# Patient Record
Sex: Female | Born: 1970
Health system: Southern US, Community
[De-identification: ages and names within clinical notes are randomized; demographics above are authoritative.]

## PROBLEM LIST (undated history)

## (undated) DIAGNOSIS — E7800 Pure hypercholesterolemia, unspecified: Secondary | ICD-10-CM

## (undated) HISTORY — DX: Pure hypercholesterolemia, unspecified: E78.00

## (undated) HISTORY — PX: POLYPECTOMY: SHX149

---

## 2017-01-23 ENCOUNTER — Ambulatory Visit: Payer: Self-pay | Admitting: Certified Nurse Midwife

## 2017-02-04 DIAGNOSIS — Z1389 Encounter for screening for other disorder: Secondary | ICD-10-CM | POA: Diagnosis not present

## 2017-02-04 DIAGNOSIS — Z23 Encounter for immunization: Secondary | ICD-10-CM | POA: Diagnosis not present

## 2017-02-04 DIAGNOSIS — L728 Other follicular cysts of the skin and subcutaneous tissue: Secondary | ICD-10-CM | POA: Diagnosis not present

## 2017-02-04 DIAGNOSIS — E038 Other specified hypothyroidism: Secondary | ICD-10-CM | POA: Diagnosis not present

## 2017-02-13 ENCOUNTER — Ambulatory Visit (INDEPENDENT_AMBULATORY_CARE_PROVIDER_SITE_OTHER): Payer: BLUE CROSS/BLUE SHIELD | Admitting: Obstetrics and Gynecology

## 2017-02-13 ENCOUNTER — Encounter: Payer: Self-pay | Admitting: Obstetrics and Gynecology

## 2017-02-13 VITALS — BP 139/94 | HR 83 | Ht 67.75 in | Wt 173.0 lb

## 2017-02-13 DIAGNOSIS — Z01419 Encounter for gynecological examination (general) (routine) without abnormal findings: Secondary | ICD-10-CM

## 2017-02-13 DIAGNOSIS — Z113 Encounter for screening for infections with a predominantly sexual mode of transmission: Secondary | ICD-10-CM | POA: Diagnosis not present

## 2017-02-13 DIAGNOSIS — Z1151 Encounter for screening for human papillomavirus (HPV): Secondary | ICD-10-CM

## 2017-02-13 DIAGNOSIS — Z124 Encounter for screening for malignant neoplasm of cervix: Secondary | ICD-10-CM | POA: Diagnosis not present

## 2017-02-13 NOTE — Progress Notes (Signed)
NGYN patient presents for her Annual Exam today. Patient states her cycles have become heavier  She is changing 4 -5 pads in a day.when it use it be 2-3 pads a day. Also noticed a increase in bloating.  LMP:01/23/17 Last NOI:[facility] 1.5 yrs ago WNL per pt done out of town per pt. Mammogram:1.5 years ago WNL  Contraception:Condoms  Pt declines STD testing.

## 2017-02-13 NOTE — Patient Instructions (Signed)
Uterine Fibroids Uterine fibroids are tissue masses (tumors) that can develop in the womb (uterus). They are also called leiomyomas. This type of tumor is not cancerous (benign) and does not spread to other parts of the body outside of the pelvic area, which is between the hip bones. Occasionally, fibroids may develop in the fallopian tubes, in the cervix, or on the support structures (ligaments) that surround the uterus. You can have one or many fibroids. Fibroids can vary in size, weight, and where they grow in the uterus. Some can become quite large. Most fibroids do not require medical treatment. What are the causes? A fibroid can develop when a single uterine cell keeps growing (replicating). Most cells in the human body have a control mechanism that keeps them from replicating without control. What are the signs or symptoms? Symptoms may include:  Heavy bleeding during your period.  Bleeding or spotting between periods.  Pelvic pain and pressure.  Bladder problems, such as needing to urinate more often (urinary frequency) or urgently.  Inability to reproduce offspring (infertility).  Miscarriages.  How is this diagnosed? Uterine fibroids are diagnosed through a physical exam. Your health care provider may feel the lumpy tumors during a pelvic exam. Ultrasonography and an MRI may be done to determine the size, location, and number of fibroids. How is this treated? Treatment may include:  Watchful waiting. This involves getting the fibroid checked by your health care provider to see if it grows or shrinks. Follow your health care provider's recommendations for how often to have this checked.  Hormone medicines. These can be taken by mouth or given through an intrauterine device (IUD).  Surgery. ? Removing the fibroids (myomectomy) or the uterus (hysterectomy). ? Removing blood supply to the fibroids (uterine artery embolization).  If fibroids interfere with your fertility and you  want to become pregnant, your health care provider may recommend having the fibroids removed. Follow these instructions at home:  Keep all follow-up visits as directed by your health care provider. This is important.  Take over-the-counter and prescription medicines only as told by your health care provider. ? If you were prescribed a hormone treatment, take the hormone medicines exactly as directed.  Ask your health care provider about taking iron pills and increasing the amount of dark green, leafy vegetables in your diet. These actions can help to boost your blood iron levels, which may be affected by heavy menstrual bleeding.  Pay close attention to your period and tell your health care provider about any changes, such as: ? Increased blood flow that requires you to use more pads or tampons than usual per month. ? A change in the number of days that your period lasts per month. ? A change in symptoms that are associated with your period, such as abdominal cramping or back pain. Contact a health care provider if:  You have pelvic pain, back pain, or abdominal cramps that cannot be controlled with medicines.  You have an increase in bleeding between and during periods.  You soak tampons or pads in a half hour or less.  You feel lightheaded, extra tired, or weak. Get help right away if:  You faint.  You have a sudden increase in pelvic pain. This information is not intended to replace advice given to you by your health care provider. Make sure you discuss any questions you have with your health care provider. Document Released: 01/13/2000 Document Revised: 09/15/2015 Document Reviewed: 07/14/2013 Elsevier Interactive Patient Education  [employer].  Uterine Artery Embolization for Fibroids Uterine artery embolization is a nonsurgical treatment to shrink fibroids. A thin plastic tube (catheter) is used to inject material that blocks off the blood supply to the fibroid,  which causes the fibroid to shrink. Tell a health care provider about:  Any allergies you have.  All medicines you are taking, including vitamins, herbs, eye drops, creams, and over-the-counter medicines.  Any problems you or family members have had with anesthetic medicines.  Any blood disorders you have.  Any surgeries you have had.  Any medical conditions you have. What are the risks?  Injury to the uterus from decreased blood supply  Infection.  Blood infection (septicemia).  Lack of menstrual periods (amenorrhea).  Death of tissue cells (necrosis) around your bladder or vulva.  Development of a hole between organs or from an organ to the surface of your skin (fistula).  Blood clot in the legs (deep vein thrombosis) or lung (pulmonary embolus). What happens before the procedure?  Ask your health care provider about changing or stopping your regular medicines.  Do not take aspirin or blood thinners (anticoagulants) for 1 week before the surgery or as directed by your health care provider.  Do not eat or drink anything for 8 hours before the surgery or as directed by your health care provider.  Empty your bladder before the procedure begins. What happens during the procedure?  An IV tube will be placed into one of your veins. This will be used to give you a sedative and pain medication (conscious sedation).  You will be given a medicine that numbs the area (local anesthetic).  A small cut will be made in your groin. A catheter is then inserted into the main artery of your leg.  The catheter will be guided through the artery to your uterus. A series of images will be taken while dye is injected through the catheter in your groin. X-rays are taken at the same time. This is done to provide a road map of the blood supply to your uterus and fibroids.  Tiny plastic spheres, about the size of sand grains, will be injected through the catheter. Metal coils may be used to  help block the artery. The particles will lodge in tiny branches of the uterine artery that supplies blood to the fibroids.  The procedure is repeated on the artery that supplies the other side of the uterus.  The catheter is then removed and pressure is held to stop any bleeding. No stitches are needed.  A dressing is then placed over the cut (incision). What happens after the procedure?  You will be taken to a recovery area where your progress will be monitored until you are awake, stable, and taking fluids well. If there are no other problems, you will then be moved to a regular hospital room.  You will be observed overnight in the hospital.  You will have cramping that should be controlled with pain medication. This information is not intended to replace advice given to you by your health care provider. Make sure you discuss any questions you have with your health care provider. Document Released: 04/02/2005 Document Revised: 06/23/2015 Document Reviewed: 07/31/2012 Elsevier Interactive Patient Education  [employer].

## 2017-02-13 NOTE — Progress Notes (Signed)
Subjective:     Vickie Castro is a 46 y.o. female P2 with BMI 26 who is here for a comprehensive physical exam. The patient reports no problems. She reports a monthly period lasting 4-5 days with the second day being heaviest for 3-4 hours. She denies any pelvic pain or abnormal discharge. She is sexually active using condoms for contraception. She denies any urinary incontinence  Past Medical History:  Diagnosis Date  . High cholesterol    Past Surgical History:  Procedure Laterality Date  . POLYPECTOMY     Vaginal per pt    No family history on file.   Social History   Socioeconomic History  . Marital status: Married    Spouse name: Not on file  . Number of children: Not on file  . Years of education: Not on file  . Highest education level: Not on file  Social Needs  . Financial resource strain: Not on file  . Food insecurity - worry: Not on file  . Food insecurity - inability: Not on file  . Transportation needs - medical: Not on file  . Transportation needs - non-medical: Not on file  Occupational History  . Not on file  Tobacco Use  . Smoking status: Never Smoker  . Smokeless tobacco: Never Used  Substance and Sexual Activity  . Alcohol use: Yes    Frequency: Never    Comment: socially   . Drug use: No  . Sexual activity: Yes    Partners: Male    Birth control/protection: Condom  Other Topics Concern  . Not on file  Social History Narrative  . Not on file   Health Maintenance  Topic Date Due  . HIV Screening  09/23/1987  . TETANUS/TDAP  09/23/1991  . PAP SMEAR  09/22/1993  . INFLUENZA VACCINE  08/29/2016    Review of Systems Pertinent items are noted in HPI.   Objective:  Blood pressure (!) 139/94, pulse 83, height 5' 7.75" (1.721 m), weight 173 lb (78.5 kg).     GENERAL: Well-developed, well-nourished female in no acute distress.  HEENT: Normocephalic, atraumatic. Sclerae anicteric.  NECK: Supple. Normal thyroid.  LUNGS: Clear to auscultation  bilaterally.  HEART: Regular rate and rhythm. BREASTS: Symmetric in size. No palpable masses or lymphadenopathy, skin changes, or nipple drainage. ABDOMEN: Soft, nontender, nondistended. Palpable fibroid uterus, not mobile PELVIC: Normal external female genitalia. Vagina is pink and rugated.  Normal discharge. Normal appearing cervix. Uterus is 14-weeks in size, not mobile. No adnexal mass or tenderness. EXTREMITIES: No cyanosis, clubbing, or edema, 2+ distal pulses.    Assessment:    Healthy female exam.      Plan:    Pap smear collected Screening mammogram ordered Pelvic ultrasound ordered Patient states she is scheduled to see PCP in March. Will defer lab work to PCP (HgA1c, lipid profile, etc.) Patient advised to perform monthly self breast and vulva exam Patient will be contacted with abnormal results Discussed fibroid uterus and their associated symptoms. Patient is currently asymptomatic. Discussed treatment options via hysterectomy and [place] (pending ultrasound results). Information provided. Patient will be contacted with ultrasound results RTC in 1 year or prn See After Visit Summary for Counseling Recommendations

## 2017-02-14 LAB — CERVICOVAGINAL ANCILLARY ONLY
Bacterial vaginitis: NEGATIVE
CHLAMYDIA, DNA PROBE: NEGATIVE
Candida vaginitis: NEGATIVE
Neisseria Gonorrhea: NEGATIVE
Trichomonas: NEGATIVE

## 2017-02-15 LAB — CYTOLOGY - PAP
Diagnosis: NEGATIVE
HPV (WINDOPATH): NOT DETECTED

## 2017-02-20 ENCOUNTER — Other Ambulatory Visit: Payer: BLUE CROSS/BLUE SHIELD

## 2017-02-27 ENCOUNTER — Ambulatory Visit
Admission: RE | Admit: 2017-02-27 | Discharge: 2017-02-27 | Disposition: A | Discharge status: Home / Self Care | Payer: BLUE CROSS/BLUE SHIELD | Source: Ambulatory Visit | Attending: Obstetrics and Gynecology | Admitting: Obstetrics and Gynecology

## 2017-02-27 ENCOUNTER — Other Ambulatory Visit: Payer: Self-pay | Admitting: Obstetrics and Gynecology

## 2017-02-27 DIAGNOSIS — D259 Leiomyoma of uterus, unspecified: Secondary | ICD-10-CM | POA: Diagnosis not present

## 2017-02-27 DIAGNOSIS — D252 Subserosal leiomyoma of uterus: Secondary | ICD-10-CM

## 2017-02-27 DIAGNOSIS — Z01419 Encounter for gynecological examination (general) (routine) without abnormal findings: Secondary | ICD-10-CM

## 2017-03-04 ENCOUNTER — Telehealth: Payer: Self-pay

## 2017-03-04 NOTE — Telephone Encounter (Signed)
TC to patient to make her aware of U/S results. Pt not ava left detailed message for pt to contact the office.

## 2017-03-04 NOTE — Telephone Encounter (Signed)
-----   Message from Mora Bellman, MD sent at 02/27/2017  2:39 PM EST ----- Please inform patient of ultrasound showing a large fibroid uterus measuring 9-10 cm. Because she does not have symptoms based on our last conversation, I think that she could explore uterine artery embolization as a treatment options (referral to interventional radiology placed in Epic).   Again, in the absence of symptoms, no interventions are needed for fibroids (we can simply monitor her fibroid over the years). She had these fibroids for several years already. They do not become cancerous.  Let me know if she has any questions  Vickii Chafe

## 2017-03-05 ENCOUNTER — Other Ambulatory Visit: Payer: Self-pay

## 2017-03-07 ENCOUNTER — Encounter: Payer: Self-pay | Admitting: Radiology

## 2017-03-07 ENCOUNTER — Ambulatory Visit
Admission: RE | Admit: 2017-03-07 | Discharge: 2017-03-07 | Disposition: A | Discharge status: Home / Self Care | Payer: BLUE CROSS/BLUE SHIELD | Source: Ambulatory Visit | Attending: Obstetrics and Gynecology | Admitting: Obstetrics and Gynecology

## 2017-03-07 DIAGNOSIS — Z1231 Encounter for screening mammogram for malignant neoplasm of breast: Secondary | ICD-10-CM | POA: Diagnosis not present

## 2017-03-07 DIAGNOSIS — Z01419 Encounter for gynecological examination (general) (routine) without abnormal findings: Secondary | ICD-10-CM

## 2017-04-11 DIAGNOSIS — Z0000 Encounter for general adult medical examination without abnormal findings: Secondary | ICD-10-CM | POA: Diagnosis not present

## 2017-04-11 DIAGNOSIS — E038 Other specified hypothyroidism: Secondary | ICD-10-CM | POA: Diagnosis not present

## 2017-04-24 DIAGNOSIS — E785 Hyperlipidemia, unspecified: Secondary | ICD-10-CM | POA: Diagnosis not present

## 2017-04-24 DIAGNOSIS — Z0000 Encounter for general adult medical examination without abnormal findings: Secondary | ICD-10-CM | POA: Diagnosis not present

## 2017-04-24 DIAGNOSIS — Z1389 Encounter for screening for other disorder: Secondary | ICD-10-CM | POA: Diagnosis not present

## 2017-04-24 DIAGNOSIS — D259 Leiomyoma of uterus, unspecified: Secondary | ICD-10-CM | POA: Diagnosis not present

## 2017-04-24 DIAGNOSIS — E039 Hypothyroidism, unspecified: Secondary | ICD-10-CM | POA: Diagnosis not present

## 2018-02-20 DIAGNOSIS — Z1151 Encounter for screening for human papillomavirus (HPV): Secondary | ICD-10-CM | POA: Diagnosis not present

## 2018-02-20 DIAGNOSIS — Z01419 Encounter for gynecological examination (general) (routine) without abnormal findings: Secondary | ICD-10-CM | POA: Diagnosis not present

## 2018-02-20 DIAGNOSIS — Z113 Encounter for screening for infections with a predominantly sexual mode of transmission: Secondary | ICD-10-CM | POA: Diagnosis not present

## 2018-03-25 DIAGNOSIS — D259 Leiomyoma of uterus, unspecified: Secondary | ICD-10-CM | POA: Diagnosis not present

## 2018-04-22 DIAGNOSIS — E038 Other specified hypothyroidism: Secondary | ICD-10-CM | POA: Diagnosis not present

## 2018-04-22 DIAGNOSIS — E7849 Other hyperlipidemia: Secondary | ICD-10-CM | POA: Diagnosis not present

## 2018-04-23 DIAGNOSIS — R82998 Other abnormal findings in urine: Secondary | ICD-10-CM | POA: Diagnosis not present

## 2018-04-29 DIAGNOSIS — D259 Leiomyoma of uterus, unspecified: Secondary | ICD-10-CM | POA: Diagnosis not present

## 2018-04-29 DIAGNOSIS — Z1331 Encounter for screening for depression: Secondary | ICD-10-CM | POA: Diagnosis not present

## 2018-04-29 DIAGNOSIS — E039 Hypothyroidism, unspecified: Secondary | ICD-10-CM | POA: Diagnosis not present

## 2018-04-29 DIAGNOSIS — E785 Hyperlipidemia, unspecified: Secondary | ICD-10-CM | POA: Diagnosis not present

## 2018-04-29 DIAGNOSIS — Z0000 Encounter for general adult medical examination without abnormal findings: Secondary | ICD-10-CM | POA: Diagnosis not present

## 2018-10-10 DIAGNOSIS — Z23 Encounter for immunization: Secondary | ICD-10-CM | POA: Diagnosis not present

## 2018-10-16 DIAGNOSIS — E038 Other specified hypothyroidism: Secondary | ICD-10-CM | POA: Diagnosis not present

## 2019-04-14 DIAGNOSIS — D259 Leiomyoma of uterus, unspecified: Secondary | ICD-10-CM | POA: Diagnosis not present

## 2019-04-14 DIAGNOSIS — N943 Premenstrual tension syndrome: Secondary | ICD-10-CM | POA: Diagnosis not present

## 2019-04-14 DIAGNOSIS — Z1231 Encounter for screening mammogram for malignant neoplasm of breast: Secondary | ICD-10-CM | POA: Diagnosis not present

## 2019-04-14 DIAGNOSIS — Z6824 Body mass index (BMI) 24.0-24.9, adult: Secondary | ICD-10-CM | POA: Diagnosis not present

## 2019-04-14 DIAGNOSIS — Z01419 Encounter for gynecological examination (general) (routine) without abnormal findings: Secondary | ICD-10-CM | POA: Diagnosis not present

## 2019-12-31 ENCOUNTER — Other Ambulatory Visit: Payer: BC Managed Care – PPO

## 2020-02-03 ENCOUNTER — Other Ambulatory Visit: Payer: BC Managed Care – PPO

## 2020-02-08 ENCOUNTER — Other Ambulatory Visit: Payer: BC Managed Care – PPO

## 2020-02-09 ENCOUNTER — Other Ambulatory Visit: Payer: BC Managed Care – PPO

## 2020-02-09 DIAGNOSIS — Z20822 Contact with and (suspected) exposure to covid-19: Secondary | ICD-10-CM

## 2020-02-11 LAB — SARS-COV-2, NAA 2 DAY TAT

## 2020-02-11 LAB — NOVEL CORONAVIRUS, NAA: SARS-CoV-2, NAA: DETECTED — AB

## 2020-06-08 DIAGNOSIS — E039 Hypothyroidism, unspecified: Secondary | ICD-10-CM | POA: Diagnosis not present

## 2020-06-08 DIAGNOSIS — E785 Hyperlipidemia, unspecified: Secondary | ICD-10-CM | POA: Diagnosis not present

## 2020-06-15 DIAGNOSIS — Z0000 Encounter for general adult medical examination without abnormal findings: Secondary | ICD-10-CM | POA: Diagnosis not present

## 2020-06-15 DIAGNOSIS — R82998 Other abnormal findings in urine: Secondary | ICD-10-CM | POA: Diagnosis not present

## 2020-06-15 DIAGNOSIS — Z1212 Encounter for screening for malignant neoplasm of rectum: Secondary | ICD-10-CM | POA: Diagnosis not present

## 2020-06-15 DIAGNOSIS — L659 Nonscarring hair loss, unspecified: Secondary | ICD-10-CM | POA: Diagnosis not present

## 2020-06-15 DIAGNOSIS — Z1331 Encounter for screening for depression: Secondary | ICD-10-CM | POA: Diagnosis not present

## 2020-06-15 DIAGNOSIS — Z1389 Encounter for screening for other disorder: Secondary | ICD-10-CM | POA: Diagnosis not present

## 2020-06-15 DIAGNOSIS — E039 Hypothyroidism, unspecified: Secondary | ICD-10-CM | POA: Diagnosis not present

## 2020-06-17 DIAGNOSIS — Z113 Encounter for screening for infections with a predominantly sexual mode of transmission: Secondary | ICD-10-CM | POA: Diagnosis not present

## 2020-06-17 DIAGNOSIS — Z1231 Encounter for screening mammogram for malignant neoplasm of breast: Secondary | ICD-10-CM | POA: Diagnosis not present

## 2020-06-17 DIAGNOSIS — Z124 Encounter for screening for malignant neoplasm of cervix: Secondary | ICD-10-CM | POA: Diagnosis not present

## 2020-06-17 DIAGNOSIS — Z01411 Encounter for gynecological examination (general) (routine) with abnormal findings: Secondary | ICD-10-CM | POA: Diagnosis not present

## 2020-06-17 DIAGNOSIS — Z01419 Encounter for gynecological examination (general) (routine) without abnormal findings: Secondary | ICD-10-CM | POA: Diagnosis not present

## 2020-06-21 DIAGNOSIS — D259 Leiomyoma of uterus, unspecified: Secondary | ICD-10-CM | POA: Diagnosis not present

## 2020-06-23 ENCOUNTER — Other Ambulatory Visit: Payer: Self-pay | Admitting: Internal Medicine

## 2020-06-23 DIAGNOSIS — E785 Hyperlipidemia, unspecified: Secondary | ICD-10-CM

## 2020-07-21 ENCOUNTER — Ambulatory Visit
Admission: RE | Admit: 2020-07-21 | Discharge: 2020-07-21 | Disposition: A | Discharge status: Home / Self Care | Payer: No Typology Code available for payment source | Source: Ambulatory Visit | Attending: Internal Medicine | Admitting: Internal Medicine

## 2020-07-21 DIAGNOSIS — E785 Hyperlipidemia, unspecified: Secondary | ICD-10-CM

## 2020-09-21 DIAGNOSIS — Z1211 Encounter for screening for malignant neoplasm of colon: Secondary | ICD-10-CM | POA: Diagnosis not present

## 2020-09-21 DIAGNOSIS — Z1212 Encounter for screening for malignant neoplasm of rectum: Secondary | ICD-10-CM | POA: Diagnosis not present

## 2022-04-23 IMAGING — CT CT CARDIAC CORONARY ARTERY CALCIUM SCORE
3 series · 13 of 20 positions shown, 15 images · non-contrast
Comparison: None.

CLINICAL DATA: 49-year-old [place] female with history of
hyperlipidemia.

EXAM:
CT CARDIAC CORONARY ARTERY CALCIUM SCORE
TECHNIQUE: Non-contrast imaging through the heart was performed using
prospective ECG gating. Image post processing was performed on an
independent workstation, allowing for quantitative analysis of the
heart and coronary arteries. Note that this exam targets the heart
and the chest was not imaged in its entirety.

[Series 2: calcium scoring 2.00 qr36 bestdiast 67% hrt calciu · axial · 0.27mm/px · z∈[+1680,+1730]mm · 3 of 64 slices shown]
[im 13/64  vessel]
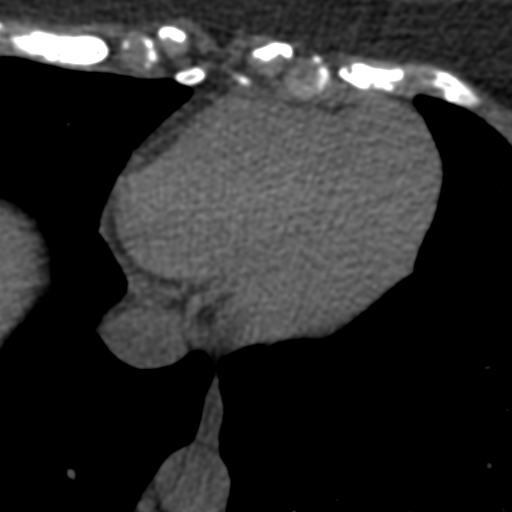
[im 26/64  vessel]
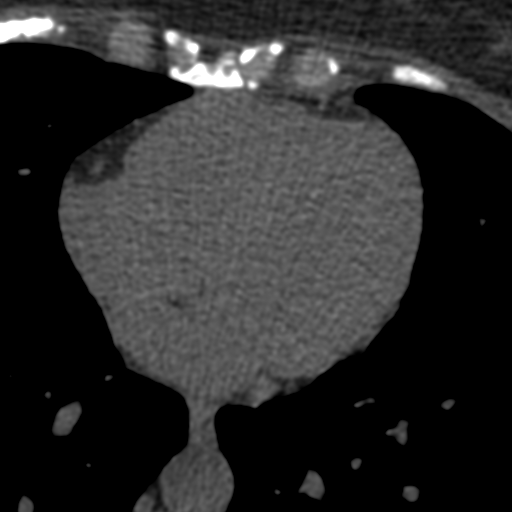
[im 38/64  vessel]
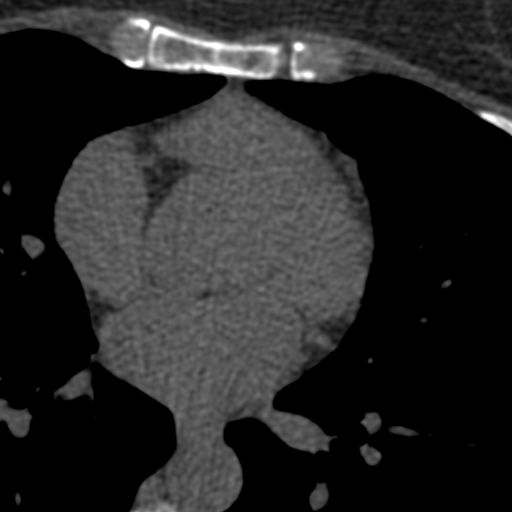

[Series 3: calcium scoring 2.00 br40 bestdiast 67% axial · axial · 0.55mm/px · z∈[+1676,+1760]mm · 5 of 64 slices shown, 7 images]
[im 11/64  vessel]
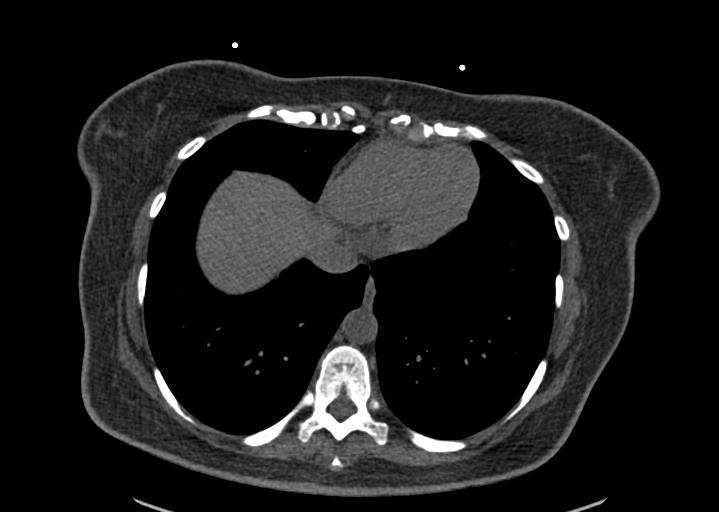
[im 11/64  lung]
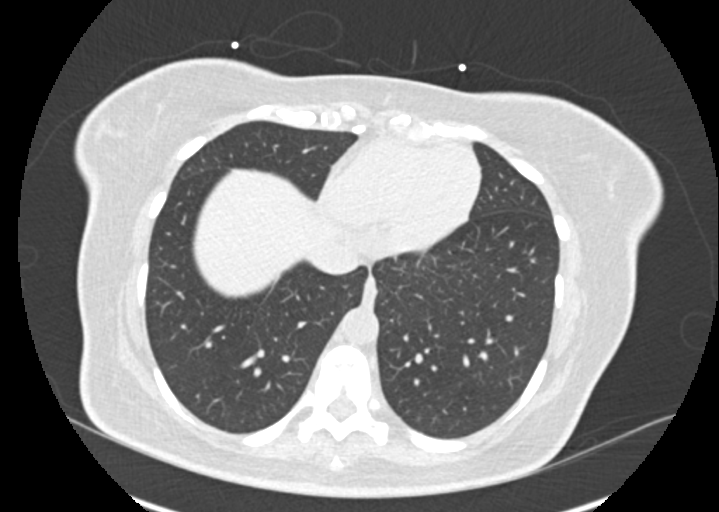
[im 22/64  vessel]
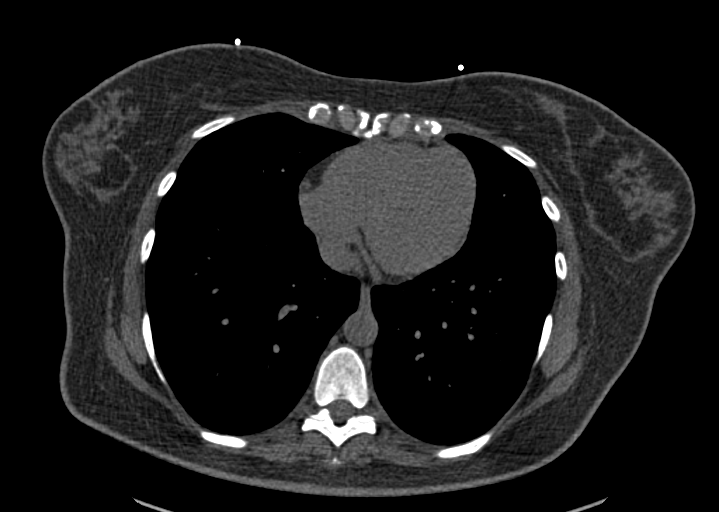
[im 32/64  vessel]
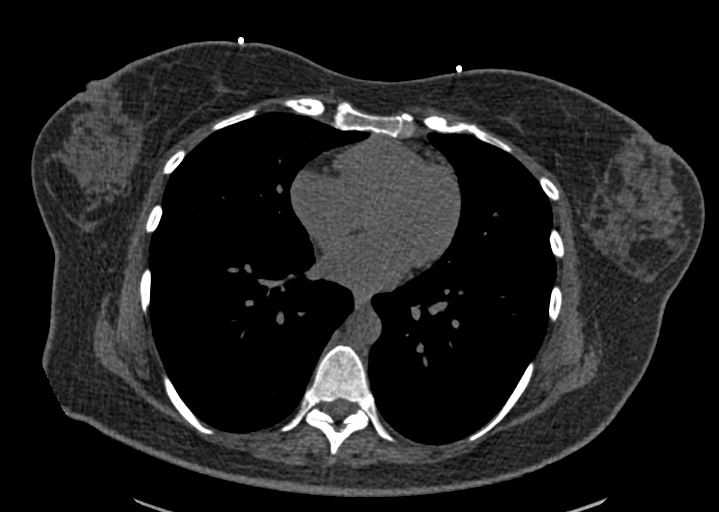
[im 43/64  vessel]
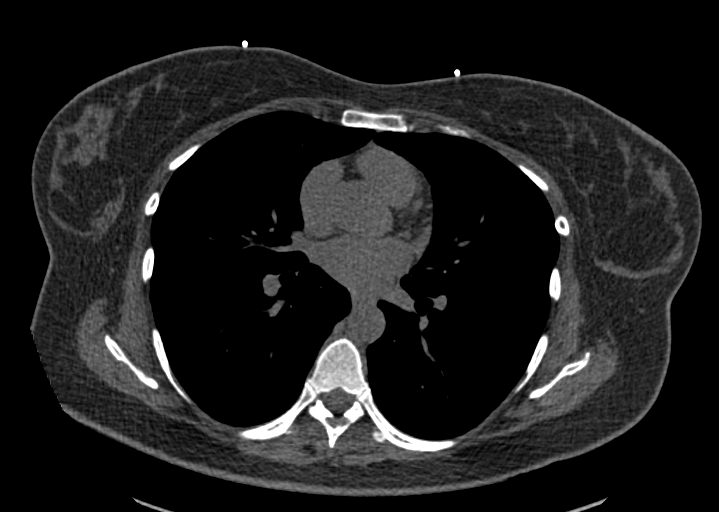
[im 53/64  vessel]
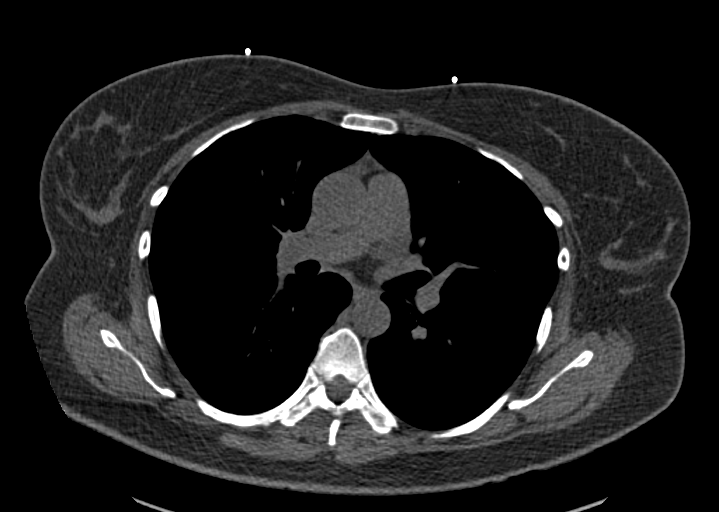
[im 53/64  lung]
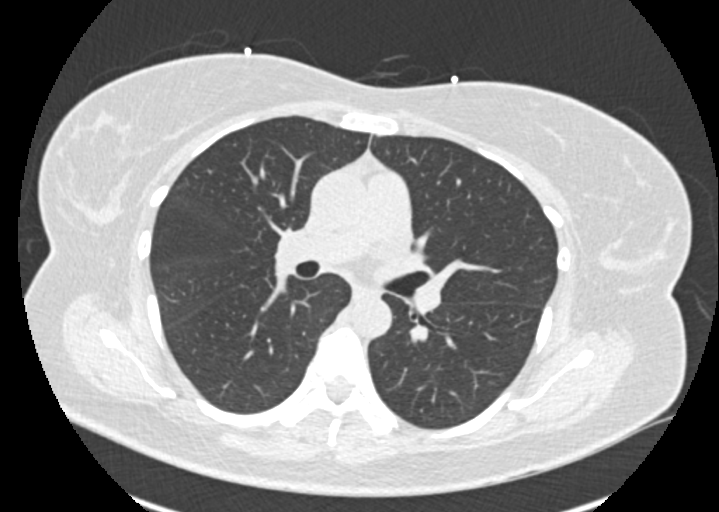

[Series 9: calcium scoring 2.00 br60 bestdiast 67% lungs · axial · 0.55mm/px · z∈[+1676,+1760]mm · 5 of 64 slices shown]
[im 11/64  vessel]
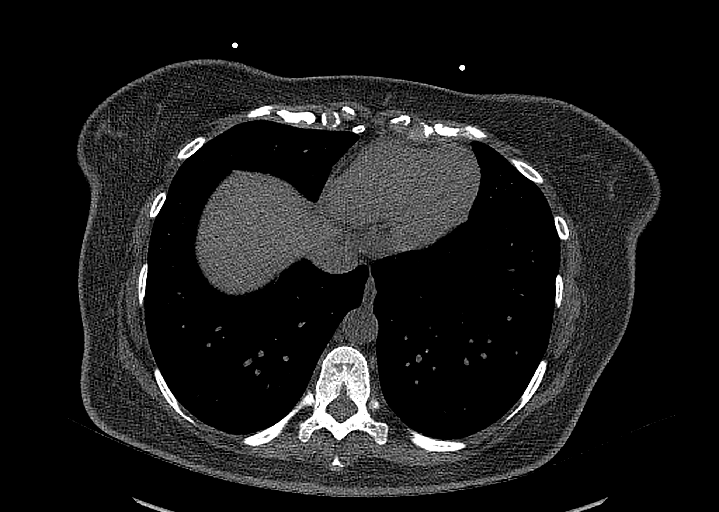
[im 22/64  vessel]
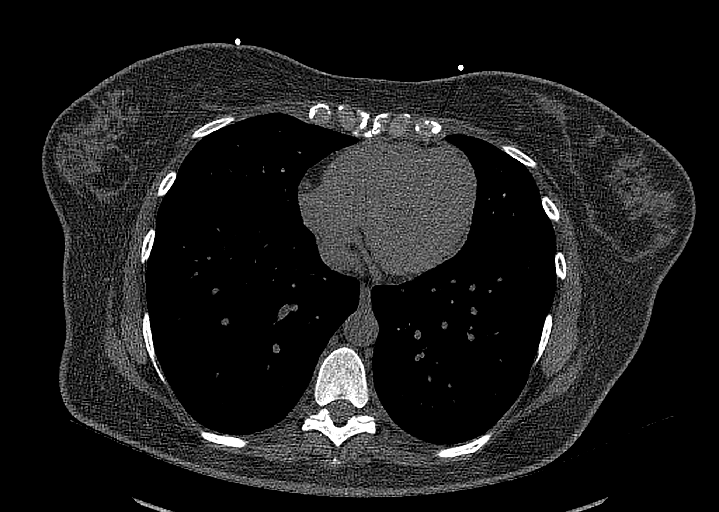
[im 32/64  vessel]
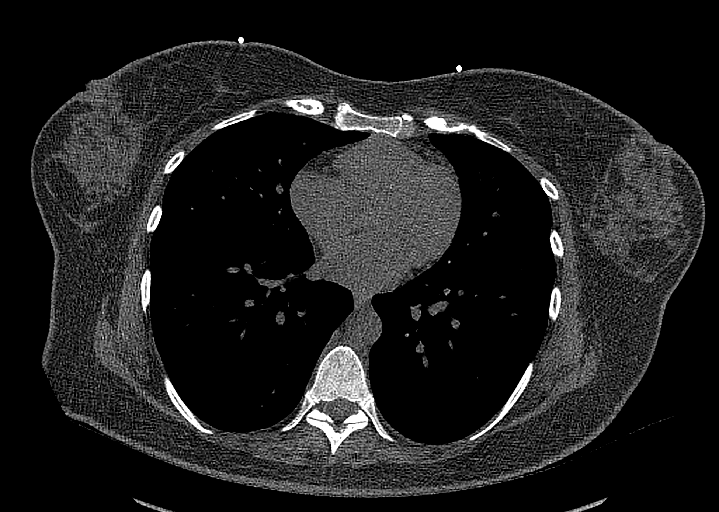
[im 43/64  vessel]
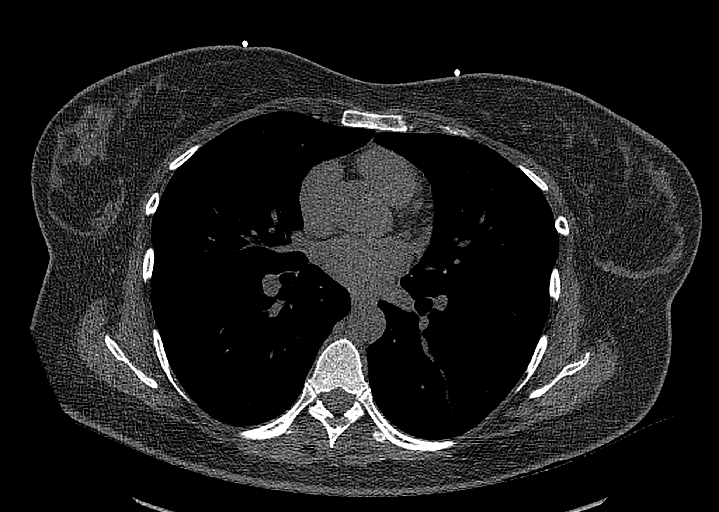
[im 53/64  vessel]
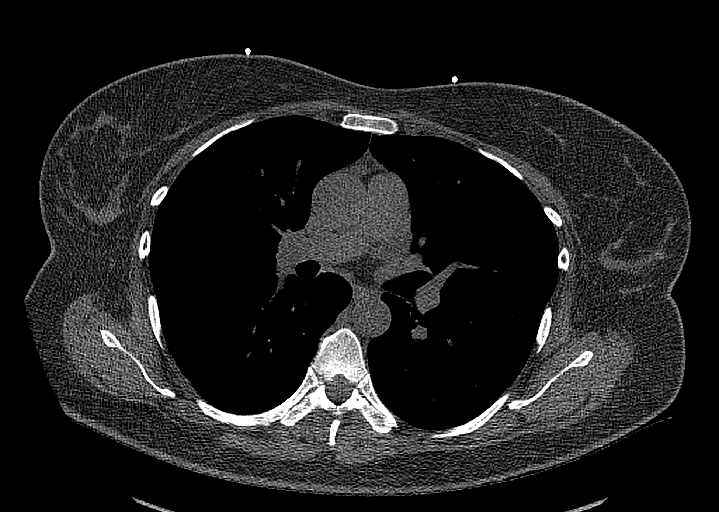

[13 of 20 positions shown; findings below may reference images not displayed]

FINDINGS: CORONARY CALCIUM SCORES:

Left Main: 0

LAD: 0

LCx: 0

RCA:

Total Agatston Score:

[HOSPITAL] percentile: 91

AORTA MEASUREMENTS:

Ascending Aorta: 29 mm

Descending Aorta: 20 mm

OTHER FINDINGS:

The heart size is within normal limits. No pericardial fluid is
identified. Visualized segments of the thoracic aorta and central
pulmonary arteries are normal in caliber. Visualized mediastinum and
hilar regions demonstrate no lymphadenopathy or masses. Visualized
lungs show no evidence of pulmonary edema, consolidation,
pneumothorax, nodule or pleural fluid. Visualized upper abdomen and
bony structures are unremarkable.
IMPRESSION: Coronary calcium score of 1.4 is at the 91st percentile for the
patient's age, sex and race.
